# Patient Record
Sex: Female | Born: 1980 | Hispanic: No | Marital: Married
Health system: Midwestern US, Community
[De-identification: ages and names within clinical notes are randomized; demographics above are authoritative.]

## PROBLEM LIST (undated history)

## (undated) HISTORY — PX: ABDOMINAL SURGERY: SHX537

---

## 2003-01-25 ENCOUNTER — Encounter (INDEPENDENT_AMBULATORY_CARE_PROVIDER_SITE_OTHER): Payer: Self-pay

## 2003-01-25 ENCOUNTER — Ambulatory Visit (HOSPITAL_COMMUNITY): Admission: RE | Admit: 2003-01-25 | Discharge: 2003-01-25 | Payer: Self-pay | Admitting: Obstetrics and Gynecology

## 2004-10-15 ENCOUNTER — Emergency Department (HOSPITAL_COMMUNITY): Admission: EM | Admit: 2004-10-15 | Discharge: 2004-10-15 | Payer: Self-pay | Admitting: Emergency Medicine

## 2008-04-27 ENCOUNTER — Inpatient Hospital Stay (HOSPITAL_COMMUNITY): Admission: RE | Admit: 2008-04-27 | Discharge: 2008-04-29 | Payer: Self-pay | Admitting: Obstetrics and Gynecology

## 2008-08-02 ENCOUNTER — Emergency Department (HOSPITAL_COMMUNITY): Admission: EM | Admit: 2008-08-02 | Discharge: 2008-08-03 | Payer: Self-pay | Admitting: Emergency Medicine

## 2008-08-04 ENCOUNTER — Inpatient Hospital Stay (HOSPITAL_COMMUNITY): Admission: EM | Admit: 2008-08-04 | Discharge: 2008-08-08 | Payer: Self-pay | Admitting: Emergency Medicine

## 2008-08-06 ENCOUNTER — Ambulatory Visit: Payer: Self-pay | Admitting: Internal Medicine

## 2008-08-12 ENCOUNTER — Ambulatory Visit (HOSPITAL_COMMUNITY): Admission: RE | Admit: 2008-08-12 | Discharge: 2008-08-12 | Payer: Self-pay | Admitting: Gastroenterology

## 2008-08-12 ENCOUNTER — Encounter (INDEPENDENT_AMBULATORY_CARE_PROVIDER_SITE_OTHER): Payer: Self-pay | Admitting: Gastroenterology

## 2008-12-16 ENCOUNTER — Emergency Department (HOSPITAL_COMMUNITY): Admission: EM | Admit: 2008-12-16 | Discharge: 2008-12-17 | Payer: Self-pay | Admitting: Emergency Medicine

## 2008-12-19 ENCOUNTER — Encounter: Admission: RE | Admit: 2008-12-19 | Discharge: 2008-12-19 | Payer: Self-pay | Admitting: Gastroenterology

## 2008-12-23 ENCOUNTER — Ambulatory Visit (HOSPITAL_COMMUNITY): Admission: RE | Admit: 2008-12-23 | Discharge: 2008-12-23 | Payer: Self-pay | Admitting: Gastroenterology

## 2009-01-15 ENCOUNTER — Emergency Department (HOSPITAL_COMMUNITY): Admission: EM | Admit: 2009-01-15 | Discharge: 2009-01-15 | Payer: Self-pay | Admitting: Emergency Medicine

## 2009-04-07 ENCOUNTER — Ambulatory Visit (HOSPITAL_COMMUNITY): Admission: RE | Admit: 2009-04-07 | Discharge: 2009-04-07 | Payer: Self-pay | Admitting: Gastroenterology

## 2009-07-07 ENCOUNTER — Ambulatory Visit (HOSPITAL_COMMUNITY): Admission: RE | Admit: 2009-07-07 | Discharge: 2009-07-07 | Payer: Self-pay | Admitting: Gastroenterology

## 2011-01-17 LAB — CBC: WBC: 8.6 10*3/uL (ref 4.0–10.5)

## 2011-01-17 LAB — URINE MICROSCOPIC-ADD ON

## 2011-01-17 LAB — URINALYSIS, ROUTINE W REFLEX MICROSCOPIC
Hgb urine dipstick: NEGATIVE
Nitrite: NEGATIVE
Protein, ur: 30 mg/dL — AB
Urobilinogen, UA: 1 mg/dL (ref 0.0–1.0)
pH: 6 (ref 5.0–8.0)

## 2011-01-17 LAB — DIFFERENTIAL
Basophils Absolute: 0 10*3/uL (ref 0.0–0.1)
Eosinophils Relative: 2 % (ref 0–5)
Neutrophils Relative %: 70 % (ref 43–77)

## 2011-01-17 LAB — COMPREHENSIVE METABOLIC PANEL
ALT: 11 U/L (ref 0–35)
Albumin: 4.4 g/dL (ref 3.5–5.2)
Alkaline Phosphatase: 81 U/L (ref 39–117)
CO2: 25 mEq/L (ref 19–32)
Calcium: 9.5 mg/dL (ref 8.4–10.5)
Chloride: 104 mEq/L (ref 96–112)
Creatinine, Ser: 0.79 mg/dL (ref 0.4–1.2)
GFR calc Af Amer: >60 mL/min (ref >60)
Glucose, Bld: 104 mg/dL — ABNORMAL HIGH (ref 70–99)
Sodium: 138 mEq/L (ref 135–145)
Total Bilirubin: 1.5 mg/dL — ABNORMAL HIGH (ref 0.3–1.2)
Total Protein: 7 g/dL (ref 6.0–8.3)

## 2011-02-19 NOTE — Discharge Summary (Signed)
[name], [name]             ACCOUNT NO.:  [id number]   MEDICAL RECORD NO.:  [id number]          PATIENT TYPE:  INP   LOCATION:  9148                          FACILITY:  [facility]   PHYSICIAN:  Kelli [name], M.D. DATE OF BIRTH:  12/26/1980   DATE OF ADMISSION:  04/27/2008  DATE OF DISCHARGE:  04/29/2008                               DISCHARGE SUMMARY   This is a 30 year old black female, para 0-0-2-0, gravida 3, EDC April 30, 2008 admitted for induction of labor.  Blood group and type B+,  negative antibody, sickle cell AF, negative for sickle cell, RPR  nonreactive, rubella immune, hepatitis B surface antigen negative, HIV  negative, GC and chlamydia negative, 1-hour Glucola 103, and group B  strep negative.  Vaginal ultrasound on September 30, 2007, crown-rump  length 2.84 cm, 9 weeks 4 days, [facility] April 30, 2008.  She declined first  and second trimester screening.  Ultrasound on December 11, 2007 showed a  gestational age of [redacted] weeks 3 days, [facility] April 29, 2008.  The patient was  treated with iron for anemia.  Prenatal care was uncomplicated.   ALLERGIES:  No known allergies.   OPERATIONS:  In 2007, she had a left salpingectomy for tubal pregnancy.  No illnesses.   FAMILY HISTORY:  Maternal grandmother with diabetes and pancreatic  cancer.   HABITS:  Alcohol, tobacco and drugs none.   OBSTETRIC HISTORY:  In 2004 D&C for nonviable intrauterine pregnancy,  2007 left salpingectomy for left tubal pregnancy.   On admission, her vital signs were normal.  Heart and lungs were normal.  The abdomen was soft.  Fundal height had been 37 cm on March 30, 2008.  Fetal heart tones were normal.  Cervix was 1-2 cm, 70% vertex at -2 per  the admitting nurse.  Pitocin was begun per protocol by 8:57 a.m.  Pitocin was at 5 mU/minute.  Contractions every 2-3 minutes, but the  patient did not feel when the cervix was 2 cm, 70% vertex at -2.  Artificial rupture of the membranes produced clear fluid.  At  12:45  p.m., the Pitocin was at 16 mU/minute, contractions every 1 minute 45  seconds to 3+ minutes, cervix was 2 cm, 80%-90%, vertex was at -1.  By  1:45 p.m., the cervix was 3 cm, 90%, and she requested and received an  epidural.  At 6 p.m., Pitocin was at 18 mU/minute.  Cervix was 9+ cm.  The patient reached full dilatation and pushed well.  She delivered  spontaneously LOA over an intact perineum by Dr. Ambrose [name] a living female  infant, 7 pounds 6 ounces.  Apgars were 9 and 10 at 1 and 5 minutes.  Placenta was intact.  The uterus was normal.  Blood loss was about 400  mL.  Postpartum, the patient did well and was discharged on the second  postpartum day.  Initial hemoglobin 11.0, hematocrit 32.5, white count  12,000, platelet count 303,000.  Followup hemoglobin 9.6.  RPR was  nonreactive.   FINAL DIAGNOSIS:  Intrauterine pregnancy at 39 weeks delivered  left  occiput anterior.  OPERATION:  Spontaneous delivery,  left occiput anterior.   FINAL CONDITION:  Improved.   INSTRUCTIONS:  Include our regular discharge instruction booklet.  The  patient declines analgesics at discharge and is advised to return in 6  weeks for followup examination.      Kelli [name], M.D.  Electronically Signed     TFH/MEDQ  D:  04/29/2008  T:  04/29/2008  Job:  [id number]

## 2011-02-19 NOTE — H&P (Signed)
NAMESADEE, OSLAND             ACCOUNT NO.:  [id number]   MEDICAL RECORD NO.:  [id number]          PATIENT TYPE:  INP   LOCATION:  6705                         FACILITY:  [facility]   PHYSICIAN:  Renee Ramus, MD       DATE OF BIRTH:  06/07/1981   DATE OF ADMISSION:  08/04/2008  DATE OF DISCHARGE:                              HISTORY & PHYSICAL   HISTORY OF PRESENT ILLNESS:  The patient is a 30 year old female who is  complained of abdominal pain x2 days prior to admission.  The patient  states pain is at worst, it is 9/10, currently 6/10 increasing with  food.  The patient was seen in the emergency department 1 day prior to  admission where had a negative abdominal ultrasound.  The patient denies  fevers, chills, night sweats, nausea, vomiting, diarrhea, or  constipation.  The patient denies previous history of this pain.  The  patient denies any previous abdominal conditions.   PAST MEDICAL HISTORY:  Status post salpingectomy secondary to tubal  pregnancy.   SOCIAL HISTORY:  No tobacco, no alcohol, no drugs of abuse.  She is  married, lives with her spouse at home.   FAMILY HISTORY:  Not available.   REVIEW OF SYSTEMS:  All other comprehensive review of systems are  negative.   MEDICATIONS:  Currently Percocet 5/325, this prescribed in the ER 1 day  prior to admission.   ALLERGIES:  NKDA.   PHYSICAL EXAM:  GENERAL:  This is a well-developed, well-nourished,  black female currently in no apparent distress.  VITAL SIGNS:  Blood pressure 131/97, pulse 70, respiratory rate 20,  temperature 98.4.  HEENT:  No jugular venous distention or lymphadenopathy.  Oropharynx is  clear.  Mucous membranes are pink and moist.  TMs are clear bilaterally.  Pupils are equal, reactive to light and accommodation.  Extraocular  muscles are intact.  CARDIOVASCULAR:  Regular rate and rhythm without murmurs, rubs, or  gallops.  PULMONARY:  Lungs clear to auscultation bilaterally.  ABDOMEN:  Soft,  nontender, and nondistended with no hepatosplenomegaly.  Bowel sounds are present.  She has no rebound or guarding.  EXTREMITIES:  No clubbing, cyanosis, or edema.  She has good peripheral  pulses, and dorsalis pedis and radial artery.  She is able to move all  extremities.  NEUROLOGIC: Cranial nerves II through XII are grossly intact.  She has  no focal neurological deficits.   LABS:  White count 6.6, H and H 11.4 and 34, MCV 91, platelets 260.  Sodium 138, potassium 3.2, chloride 107, bicarb 24, BUN 5, creatinine  0.9, glucose 92, T bili 1.8, AST 111, ALT 87.   STUDIES:  Abdominal ultrasound showing no acute disease.  The urine  pregnancy test done 1 day prior shows negative.  UA done 1 day prior  shows concentrated urine with specific gravity of 1.028, but no evidence  of infection.   ASSESSMENT AND PLAN:  1. Abdominal pain with mild increase in LFTs, mild increase in T bili,      and negative abdominal ultrasound.  We will check hepatic panel.  We will check inflammatory markers i.e. CRP and ESR.  We will check      abdominal CT scan with contrast.  We will also recheck labs after      intravenous hydration, although the patient shows no clinical signs      of gross dehydration.  2. Dehydration as above.  3. Hypokalemia, add potassium to intravenous fluids.  4. Hyperbilirubinemia, check iron studies, haptoglobin, and LDH.  5. Anemia as above.   DISPOSITION:  The patient is full code.  H and P was constructed by  reviewing past medical history conferring with emergency medical room  physicians reviewing the emergency medical record.   TIME SPENT:  One hour.      Renee Ramus, MD  Electronically Signed     JF/MEDQ  D:  08/04/2008  T:  08/04/2008  Job:  [id number]

## 2011-02-19 NOTE — Discharge Summary (Signed)
Kelli [name], Kelli [name]             ACCOUNT NO.:  [id number]   MEDICAL RECORD NO.:  [id number]          PATIENT TYPE:  INP   LOCATION:  6705                         FACILITY:  [facility]   PHYSICIAN:  Lucita Ferrara, MD         DATE OF BIRTH:  February 03, 1981   DATE OF ADMISSION:  08/04/2008  DATE OF DISCHARGE:                               DISCHARGE SUMMARY   error      Lucita Ferrara, MD  Electronically Signed     RR/MEDQ  D:  08/08/2008  T:  08/08/2008  Job:  [id number]

## 2011-02-19 NOTE — Discharge Summary (Signed)
Kelli [name], Kelli [name]             ACCOUNT NO.:  [id number]   MEDICAL RECORD NO.:  [id number]          PATIENT TYPE:  INP   LOCATION:  6705                         FACILITY:  [facility]   PHYSICIAN:  Lucita Ferrara, MD         DATE OF BIRTH:  July 22, 1981   DATE OF ADMISSION:  08/04/2008  DATE OF DISCHARGE:  08/08/2008                               DISCHARGE SUMMARY   DISCHARGE DIAGNOSES:  1. High-grade biliary obstruction.  2. Abnormal liver function tests.  3. Epigastric pain, tenderness resolved.   CONSULTATIONS:  Dr. Audley Hose from gastroenterology services.   PROCEDURES:  1. Patient had a magnetic resonance cholangiogram MRCP which showed      long segment, 3-cm smooth narrowing of the common bile duct through      the pancreatic head with no significant biliary dilatation.  2. Patient had a HIDA scan dated August 05, 2008, which showed      delayed excretion of the radiopharmaceutical into the biliary      system.  Differential diagnosis include high-grade biliary      obstruction versus underlying hepatic dysfunction.  3. Patient had an MRCP of the abdomen which showed as above.  4. Patient had a CT scan of the abdomen and pelvis, August 04, 2008.      which showed abnormal CT appearing of the gallbladder.  There is a      high attenuation fluid in the gallbladder and probable      pericholecystic fluid.  5. Patient had abdominal ultrasound August 03, 2008, which showed no      normal abdominal ultrasound.   BRIEF HISTORY OF PRESENT ILLNESS:  Patient is a 30 year old female who  presented to [facility] on August 04, 2008, with  abdominal pain 2 days prior to admission, 9/10 in intensity, with no  vomiting but nausea.  She was admitted to medical telemetry unit and a  gastroenterology consultation was requested.  She underwent procedures  above including HIDA scan, results above, MRCP, results above.  Diet was  advanced and currently she is tolerating a nonfat diet.   Per  gastroenterology, her ERCP will be deferred to outpatient basis as she  is currently tolerating normal diet and her liver function tests have  now normalized.   DISCHARGE DIET:  Low-fat diet.   DISCHARGE FOLLOWUP:  With Dr. Elnoria Howard in the office as scheduled time per  Dr. Elnoria Howard.   DISCHARGE LABORATORY DATA:  Hepatic function test shows normal T-bili, D-  bili, and indirect bili.  Alk phos slightly high at 128.  AST and ALT,  AST 34, ALT 60, mildly high.  A basic metabolic panel within normal  limits, as is magnesium level.   DISCHARGE CONDITION:  Stable.   PHYSICAL EXAMINATION:  GENERAL: Patient is in no acute distress.  VITALS:  Blood pressure is 106/78.  Temperature 97.8.  Pulse 83.  Respirations 20.  Pulse ox is 99% on room air.  HEENT: Normocephalic, atraumatic.  Sclerae are nonicteric.  NECK:  Supple.  No JVD.  CARDIOVASCULAR:  S1-S2.  Regular rate and rhythm.  No  murmurs, rubs, or  clicks.  ABDOMEN:  Soft, nontender, nondistended.  Positive bowel sounds.  EXTREMITIES:  No clubbing, cyanosis, or edema.      Lucita Ferrara, MD  Electronically Signed     RR/MEDQ  D:  08/08/2008  T:  08/08/2008  Job:  [id number]

## 2011-02-22 NOTE — Op Note (Signed)
   NAMEGRACIELLA, Kelli [name]                         ACCOUNT NO.:  [id number]   MEDICAL RECORD NO.:  [id number]                   PATIENT TYPE:  AMB   LOCATION:  DAY                                  [facility]ACILITY:  [facility]   PHYSICIAN:  Malachi Pro. Ambrose Mantle, M.D.              DATE OF BIRTH:  Jan 28, 1981   DATE OF PROCEDURE:  01/25/2003  DATE OF DISCHARGE:                                 OPERATIVE REPORT   PREOPERATIVE DIAGNOSES:  Intrauterine fetal demise 9 plus weeks.   POSTOPERATIVE DIAGNOSES:  Intrauterine fetal demise 9 plus weeks.   OPERATION:  Suction D&C.   SURGEON:  Malachi Pro. Ambrose Mantle, M.D.   ANESTHESIA:  General.   DESCRIPTION OF PROCEDURE:  The patient was brought to the operating room and  placed under satisfactory general anesthesia. She was placed in lithotomy  position in the [place] stirrups. The vulva, vagina and urethra were prepped  with Betadine solution and the bladder was emptied with a [place] catheter.  Exam revealed the uterus to be anterior approximately 10 week size, the  adnexa were free of masses. The cervix was drawn into the operative field  and sounded to 11 cm anteriorly. The cervix was then dilated to a 31 Pratt  dilator, a #9 curved suction curette was used to enter the uterine cavity  and suction was done. I did not seem to get much tissue. I finally found  some tissue and tried to remove some of it with the polyp forceps, I did not  get a lot of tissue removed so I went back with the suction curette and  continued to curette and with a combination of curetting and pulling the  tissue out with the polyp forceps felt like I got all the tissue out. The  uterus contracted down nicely with Pitocin. I used a sharp curette, did not  feel any additional tissue but I made another circuit with the #8 curved  suction curette and felt like that I had the entire cavity emptied, all the  tissue removed, bleeding was minimal and the procedure was terminated. The  patient seemed  to tolerate the procedure well and was returned to recovery  in satisfactory condition.                                               Malachi Pro. Ambrose Mantle, M.D.    TFH/MEDQ  D:  01/25/2003  T:  01/25/2003  Job:  [id number]

## 2011-07-05 LAB — CBC
HCT: 32.6 — ABNORMAL LOW
Hemoglobin: 11 — ABNORMAL LOW
MCV: 93.9
RBC: 3.01 — ABNORMAL LOW
RBC: 3.47 — ABNORMAL LOW
WBC: 18.3 — ABNORMAL HIGH

## 2011-07-08 LAB — COMPREHENSIVE METABOLIC PANEL
ALT: 87 — ABNORMAL HIGH
ALT: 89 — ABNORMAL HIGH
AST: 111 — ABNORMAL HIGH
AST: 65 — ABNORMAL HIGH
AST: 96 — ABNORMAL HIGH
Alkaline Phosphatase: 115
Alkaline Phosphatase: 92
BUN: 5 — ABNORMAL LOW
BUN: 6
CO2: 23
CO2: 23
CO2: 25
Calcium: 9
Calcium: 9.1
Chloride: 106
Chloride: 109
Creatinine, Ser: 0.77
Creatinine, Ser: 0.91
GFR calc Af Amer: >60
GFR calc Af Amer: >60
GFR calc non Af Amer: >60
GFR calc non Af Amer: >60
Glucose, Bld: 103 — ABNORMAL HIGH
Glucose, Bld: 70
Glucose, Bld: 92
Potassium: 3.2 — ABNORMAL LOW
Potassium: 4.2
Sodium: 136
Sodium: 138
Total Bilirubin: 0.4
Total Bilirubin: 1.9 — ABNORMAL HIGH
Total Protein: 6
Total Protein: 6.2

## 2011-07-08 LAB — DIFFERENTIAL
Basophils Absolute: 0
Basophils Relative: 1
Eosinophils Absolute: 0.4
Eosinophils Relative: 2
Eosinophils Relative: 5
Lymphocytes Relative: 24
Monocytes Absolute: 0.4
Monocytes Relative: 7
Neutro Abs: 7.2
Neutrophils Relative %: 49

## 2011-07-08 LAB — CBC
HCT: 36
Hemoglobin: 11.1 — ABNORMAL LOW
Hemoglobin: 11.4 — ABNORMAL LOW
MCV: 90.3
RBC: 3.59 — ABNORMAL LOW
RBC: 3.76 — ABNORMAL LOW
RBC: 3.99
RDW: 13.3
RDW: 13.4
WBC: 10.3

## 2011-07-08 LAB — HEPATIC FUNCTION PANEL
Albumin: 3.6
Indirect Bilirubin: 0.9
Total Protein: 6.3

## 2011-07-08 LAB — LIPID PANEL
LDL Cholesterol: 49
Triglycerides: 97

## 2011-07-08 LAB — URINALYSIS, ROUTINE W REFLEX MICROSCOPIC
Glucose, UA: NEGATIVE
Ketones, ur: NEGATIVE
Nitrite: NEGATIVE
Protein, ur: NEGATIVE
Urobilinogen, UA: 0.2
pH: 7.5

## 2011-07-08 LAB — LACTATE DEHYDROGENASE: LDH: 258 — ABNORMAL HIGH

## 2011-07-08 LAB — LIPASE, BLOOD: Lipase: 28

## 2011-07-08 LAB — FERRITIN: Ferritin: 34 (ref 10–291)

## 2011-07-08 LAB — RETICULOCYTES: Retic Count, Absolute: 47.3

## 2011-07-08 LAB — T4, FREE: Free T4: 1.25

## 2011-07-08 LAB — IRON AND TIBC: UIBC: 218

## 2011-07-08 LAB — HAPTOGLOBIN: Haptoglobin: 159

## 2011-07-08 LAB — TSH: TSH: 3.788

## 2011-07-08 LAB — C-REACTIVE PROTEIN: CRP: 0.4 — ABNORMAL LOW (ref <0.6)

## 2011-07-09 LAB — BASIC METABOLIC PANEL
Chloride: 106
GFR calc Af Amer: >60
GFR calc non Af Amer: >60
Potassium: 3.9
Sodium: 137

## 2011-07-09 LAB — HEPATIC FUNCTION PANEL
ALT: 60 — ABNORMAL HIGH
ALT: 73 — ABNORMAL HIGH
AST: 34
AST: 39 — ABNORMAL HIGH
Albumin: 3.9
Albumin: 4.3
Alkaline Phosphatase: 136 — ABNORMAL HIGH
Bilirubin, Direct: 0.2
Indirect Bilirubin: 1.1 — ABNORMAL HIGH
Total Bilirubin: 0.9
Total Protein: 6.9

## 2012-09-23 ENCOUNTER — Emergency Department (HOSPITAL_COMMUNITY)
Admission: EM | Admit: 2012-09-23 | Discharge: 2012-09-24 | Disposition: A | Discharge status: Home / Self Care | Payer: No Typology Code available for payment source | Attending: Emergency Medicine | Admitting: Emergency Medicine

## 2012-09-23 ENCOUNTER — Encounter (HOSPITAL_COMMUNITY): Payer: Self-pay

## 2012-09-23 DIAGNOSIS — S139XXA Sprain of joints and ligaments of unspecified parts of neck, initial encounter: Secondary | ICD-10-CM | POA: Insufficient documentation

## 2012-09-23 DIAGNOSIS — S46819A Strain of other muscles, fascia and tendons at shoulder and upper arm level, unspecified arm, initial encounter: Secondary | ICD-10-CM

## 2012-09-23 DIAGNOSIS — F17200 Nicotine dependence, unspecified, uncomplicated: Secondary | ICD-10-CM | POA: Insufficient documentation

## 2012-09-23 DIAGNOSIS — Y9389 Activity, other specified: Secondary | ICD-10-CM | POA: Insufficient documentation

## 2012-09-23 DIAGNOSIS — Y92410 Unspecified street and highway as the place of occurrence of the external cause: Secondary | ICD-10-CM | POA: Insufficient documentation

## 2012-09-23 NOTE — ED Notes (Signed)
Pt. Was called 3 times and there was no one in the waiting room by this name.

## 2012-09-23 NOTE — ED Notes (Signed)
Pt has come up to front desk stating that she has been waiting.  Pt had been taken off waiting list earlier d/t not being in lobby.
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
## 2012-09-24 ENCOUNTER — Emergency Department (HOSPITAL_COMMUNITY): Payer: No Typology Code available for payment source

## 2012-09-24 MED ORDER — NAPROXEN 500 MG PO TABS: 500.0000 mg | ORAL_TABLET | Freq: Two times a day (BID) | ORAL | Status: AC

## 2012-09-24 MED ORDER — METHOCARBAMOL 500 MG PO TABS: 500.0000 mg | ORAL_TABLET | Freq: Two times a day (BID) | ORAL | Status: AC

## 2012-09-24 NOTE — ED Provider Notes (Signed)
Medical screening examination/treatment/procedure(s) were performed by non-physician practitioner and as supervising physician I was immediately available for consultation/collaboration.   Karita Dralle T Kasheena Sambrano, MD 09/24/12 0816 
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
## 2015-01-06 ENCOUNTER — Inpatient Hospital Stay (HOSPITAL_COMMUNITY): Payer: 59

## 2015-01-06 ENCOUNTER — Encounter (HOSPITAL_COMMUNITY): Payer: Self-pay | Admitting: *Deleted

## 2015-01-06 ENCOUNTER — Inpatient Hospital Stay (HOSPITAL_COMMUNITY)
Admission: AD | Admit: 2015-01-06 | Discharge: 2015-01-07 | Disposition: A | Discharge status: Home / Self Care | Payer: 59 | Source: Ambulatory Visit | Attending: Obstetrics & Gynecology | Admitting: Obstetrics & Gynecology

## 2015-01-06 DIAGNOSIS — Z30431 Encounter for routine checking of intrauterine contraceptive device: Secondary | ICD-10-CM | POA: Diagnosis not present

## 2015-01-06 DIAGNOSIS — R102 Pelvic and perineal pain: Secondary | ICD-10-CM | POA: Diagnosis not present

## 2015-01-06 DIAGNOSIS — F17210 Nicotine dependence, cigarettes, uncomplicated: Secondary | ICD-10-CM | POA: Diagnosis not present

## 2015-01-06 LAB — POCT PREGNANCY, URINE: Preg Test, Ur: NEGATIVE

## 2015-01-06 MED ORDER — KETOROLAC TROMETHAMINE 60 MG/2ML IM SOLN
60.0000 mg | Freq: Once | INTRAMUSCULAR | Status: AC
  Administered 2015-01-06: 60 mg via INTRAMUSCULAR
  Filled 2015-01-06: qty 2

## 2015-01-06 NOTE — MAU Provider Note (Signed)
History     CSN: [id number]  Arrival date and time: 01/06/15 2305   None     Chief Complaint  Patient presents with  . Abdominal Pain   Abdominal Pain This is a new problem. The current episode started today. The onset quality is gradual. The problem occurs constantly. The problem has been unchanged. The pain is located in the suprapubic region. The pain is moderate. The quality of the pain is cramping and sharp. The abdominal pain does not radiate. Pertinent negatives include no constipation, diarrhea, dysuria, fever, frequency, headaches, myalgias, nausea or vomiting. The pain is aggravated by certain positions. The pain is relieved by being still. She has tried nothing for the symptoms. There is no history of abdominal surgery.   This is a 34 y.o. female who presents with c/o sharp and crampy pelvic pain which started about an hour after intercourse. It got sharper and she took a shower. Then it got more sharp. She checked her IUD string and it felt longer to her than normal. She has a MIrena placed by Dr Ambrose Mantle, but she thinks it might have been more than 5 years ago.   RN Note: Had sex about 1630 and immediately afterward started having sharp lower abd pain. Has IUD. Small amt bleeding after having sex but none now. String to IUD feels longer than normally does          OB History    Gravida Para Term Preterm AB TAB SAB Ectopic Multiple Living   No past medical history on file.  Past Surgical History  Procedure Laterality Date  . Abdominal surgery      No family history on file.  History  Substance Use Topics  . Smoking status: Current Every Day Smoker -- 0.50 packs/day    Types: Cigarettes  . Smokeless tobacco: Not on file  . Alcohol Use: Yes    Allergies: No Known Allergies  Prescriptions prior to admission  Medication Sig Dispense Refill Last Dose  . levonorgestrel (MIRENA) 20 MCG/24HR IUD 1 each by Intrauterine route once.    09/23/2012 at Unknown  . methocarbamol (ROBAXIN) 500 MG tablet Take 1 tablet (500 mg total) by mouth 2 (two) times daily. 20 tablet 0   . naproxen (NAPROSYN) 500 MG tablet Take 1 tablet (500 mg total) by mouth 2 (two) times daily. 30 tablet 0     Review of Systems  Constitutional: Negative for fever and malaise/fatigue.  Respiratory: Negative for shortness of breath.   Cardiovascular: Negative for chest pain.  Gastrointestinal: Positive for abdominal pain. Negative for nausea, vomiting, diarrhea and constipation.  Genitourinary: Negative for dysuria and frequency.  Musculoskeletal: Negative for myalgias and back pain.  Neurological: Negative for weakness and headaches.   Physical Exam   Blood pressure 118/72, pulse 91, temperature 97.8 F (36.6 C), resp. rate 24, height  (1.651 m), weight 133 lb 12.8 oz (60.691 kg).  Physical Exam  Constitutional: She is oriented to person, place, and time. She appears well-developed and well-nourished. No distress.  HENT:  Head: Normocephalic.  Cardiovascular: Normal rate and regular rhythm.   Respiratory: Effort normal.  GI: Soft. She exhibits no distension and no mass. There is tenderness (over suprapubic area). There is no rebound and no guarding.  Genitourinary: Vagina normal. No vaginal discharge found.  Cervix closed There is some cervical motion tenderness and uterine tenderness. Declines STD testing  Musculoskeletal: Normal  range of motion.  Neurological: She is alert and oriented to person, place, and time.  Skin: Skin is warm and dry.  Psychiatric: She has a normal mood and affect.    MAU Course  Procedures  MDM Will check UA, and pelvic US. WIll give Toradol for pain.  US negative. IUD in good position. Ovaries and uterus normal States Toradol did not help much. Will give a Tramadol Discussed findings. Encouraged her to seek GYN care to get IUD replaced.  Reviewed risk of pregnancy Results for orders placed or performed  during the hospital encounter of 01/06/15 (from the past 24 hour(s))  Urinalysis, Routine w reflex microscopic     Status: Abnormal   Collection Time: 01/06/15 11:20 PM  Result Value Ref Range   Color, Urine YELLOW YELLOW   APPearance CLEAR CLEAR   Specific Gravity, Urine >1.030 (H) 1.005 - 1.030   pH 6.0 5.0 - 8.0   Glucose, UA NEGATIVE NEGATIVE mg/dL   Hgb urine dipstick LARGE (A) NEGATIVE   Bilirubin Urine NEGATIVE NEGATIVE   Ketones, ur 15 (A) NEGATIVE mg/dL   Protein, ur NEGATIVE NEGATIVE mg/dL   Urobilinogen, UA 0.2 0.0 - 1.0 mg/dL   Nitrite NEGATIVE NEGATIVE   Leukocytes, UA NEGATIVE NEGATIVE  Urine microscopic-add on     Status: None   Collection Time: 01/06/15 11:20 PM  Result Value Ref Range   Squamous Epithelial / LPF RARE RARE   WBC, UA 0-2 <3 WBC/hpf   RBC / HPF 0-2 <3 RBC/hpf   Bacteria, UA RARE RARE   Urine-Other MUCOUS PRESENT   Pregnancy, urine POC     Status: None   Collection Time: 01/06/15 11:28 PM  Result Value Ref Range   Preg Test, Ur NEGATIVE NEGATIVE    Assessment and Plan  A:  Pelvic pain following intercourse      Normal IUD placement but IUD expired  P;  Discharge home       Recommend Naproxen or ibuprofen for pain       Followup with GYN   [facility] LLCWILLIAMS,Armani Gawlik 01/06/2015, 11:26 PM

## 2015-01-06 NOTE — Progress Notes (Signed)
Bimanual only  

## 2015-01-06 NOTE — MAU Note (Signed)
Had sex about 1630 and immediately afterward started having sharp lower abd pain. Has IUD. Small amt bleeding after having sex but none now. String to IUD feels longer than normally does

## 2015-01-07 DIAGNOSIS — R102 Pelvic and perineal pain: Secondary | ICD-10-CM

## 2015-01-07 LAB — URINE MICROSCOPIC-ADD ON

## 2015-01-07 LAB — URINALYSIS, ROUTINE W REFLEX MICROSCOPIC
Bilirubin Urine: NEGATIVE
Glucose, UA: NEGATIVE mg/dL
Ketones, ur: 15 mg/dL — AB
Leukocytes, UA: NEGATIVE
NITRITE: NEGATIVE
Protein, ur: NEGATIVE mg/dL
UROBILINOGEN UA: 0.2 mg/dL (ref 0.0–1.0)
pH: 6 (ref 5.0–8.0)

## 2015-01-07 MED ORDER — TRAMADOL HCL 50 MG PO TABS
50.0000 mg | ORAL_TABLET | Freq: Once | ORAL | Status: AC
  Administered 2015-01-07: 50 mg via ORAL
  Filled 2015-01-07: qty 1

## 2015-01-07 NOTE — Progress Notes (Signed)
Pt requesting more pain med. Kelli BourgeoisMarie [name] CNM made aware

## 2015-01-07 NOTE — Progress Notes (Signed)
Marie Williams CNM in earlier to discuss test results and d/c plan. Written and verbal d/c instructions given and understanding voiced °

## 2015-01-07 NOTE — Discharge Instructions (Signed)
Abdominal Pain, Women Abdominal (stomach, pelvic, or belly) pain can be caused by many things. It is important to tell your doctor:  The location of the pain.  Does it come and go or is it present all the time?  Are there things that start the pain (eating certain foods, exercise)?  Are there other symptoms associated with the pain (fever, nausea, vomiting, diarrhea)? All of this is helpful to know when trying to find the cause of the pain. CAUSES   Stomach: virus or bacteria infection, or ulcer.  Intestine: appendicitis (inflamed appendix), regional ileitis (Crohn's disease), ulcerative colitis (inflamed colon), irritable bowel syndrome, diverticulitis (inflamed diverticulum of the colon), or cancer of the stomach or intestine.  Gallbladder disease or stones in the gallbladder.  Kidney disease, kidney stones, or infection.  Pancreas infection or cancer.  Fibromyalgia (pain disorder).  Diseases of the female organs:  Uterus: fibroid (non-cancerous) tumors or infection.  Fallopian tubes: infection or tubal pregnancy.  Ovary: cysts or tumors.  Pelvic adhesions (scar tissue).  Endometriosis (uterus lining tissue growing in the pelvis and on the pelvic organs).  Pelvic congestion syndrome (female organs filling up with blood just before the menstrual period).  Pain with the menstrual period.  Pain with ovulation (producing an egg).  Pain with an IUD (intrauterine device, birth control) in the uterus.  Cancer of the female organs.  Functional pain (pain not caused by a disease, may improve without treatment).  Psychological pain.  Depression. DIAGNOSIS  Your doctor will decide the seriousness of your pain by doing an examination.  Blood tests.  X-rays.  Ultrasound.  CT scan (computed tomography, special type of X-ray).  MRI (magnetic resonance imaging).  Cultures, for infection.  Barium enema (dye inserted in the large intestine, to better view it with  X-rays).  Colonoscopy (looking in intestine with a lighted tube).  Laparoscopy (minor surgery, looking in abdomen with a lighted tube).  Major abdominal exploratory surgery (looking in abdomen with a large incision). TREATMENT  The treatment will depend on the cause of the pain.   Many cases can be observed and treated at home.  Over-the-counter medicines recommended by your caregiver.  Prescription medicine.  Antibiotics, for infection.  Birth control pills, for painful periods or for ovulation pain.  Hormone treatment, for endometriosis.  Nerve blocking injections.  Physical therapy.  Antidepressants.  Counseling with a psychologist or psychiatrist.  Minor or major surgery. HOME CARE INSTRUCTIONS  Do not take Ibuprofen (Motrin) if you are also taking Naproxen. You can take either one but not both.  Do not take laxatives, unless directed by your caregiver.  Take over-the-counter pain medicine only if ordered by your caregiver. Do not take aspirin because it can cause an upset stomach or bleeding.  Try a clear liquid diet (broth or water) as ordered by your caregiver. Slowly move to a bland diet, as tolerated, if the pain is related to the stomach or intestine.  Have a thermometer and take your temperature several times a day, and record it.  Bed rest and sleep, if it helps the pain.  Avoid sexual intercourse, if it causes pain.  Avoid stressful situations.  Keep your follow-up appointments and tests, as your caregiver orders.  If the pain does not go away with medicine or surgery, you may try:  Acupuncture.  Relaxation exercises (yoga, meditation).  Group therapy.  Counseling. SEEK MEDICAL CARE IF:   You notice certain foods cause stomach pain.  Your home care treatment is not helping  your pain.  You need stronger pain medicine.  You want your IUD removed.  You feel faint or lightheaded.  You develop nausea and vomiting.  You develop a  rash.  You are having side effects or an allergy to your medicine. SEEK IMMEDIATE MEDICAL CARE IF:   Your pain does not go away or gets worse.  You have a fever.  Your pain is felt only in portions of the abdomen. The right side could possibly be appendicitis. The left lower portion of the abdomen could be colitis or diverticulitis.  You are passing blood in your stools (bright red or black tarry stools, with or without vomiting).  You have blood in your urine.  You develop chills, with or without a fever.  You pass out. MAKE SURE YOU:   Understand these instructions.  Will watch your condition.  Will get help right away if you are not doing well or get worse. Document Released: 07/21/2007 Document Revised: 02/07/2014 Document Reviewed: 08/10/2009 [facility] Patient Information 2015 [place], [place]. This information is not intended to replace advice given to you by your health care provider. Make sure you discuss any questions you have with your health care provider.

## 2015-07-11 IMAGING — US US PELVIS COMPLETE
1 series · 13 of 25 positions shown · non-contrast
Comparison: CT of the abdomen and pelvis performed 08/04/2008

CLINICAL DATA: Acute onset of sharp pelvic pain, one hour after
intercourse. Assess position of intrauterine device. Initial
encounter.

EXAM:
TRANSABDOMINAL AND TRANSVAGINAL ULTRASOUND OF PELVIS
TECHNIQUE: Both transabdominal and transvaginal ultrasound examinations of the
pelvis were performed. Transabdominal technique was performed for
global imaging of the pelvis including uterus, ovaries, adnexal
regions, and pelvic cul-de-sac. It was necessary to proceed with
endovaginal exam following the transabdominal exam to visualize the
uterus and ovaries in greater detail.

[Series 1: us pelvis complete · 13 of 44 slices shown]
[im 1/44]
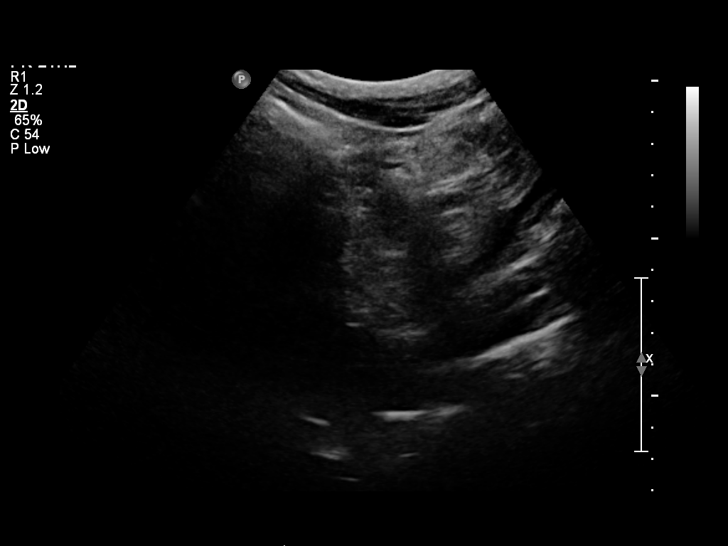
[im 4/44]
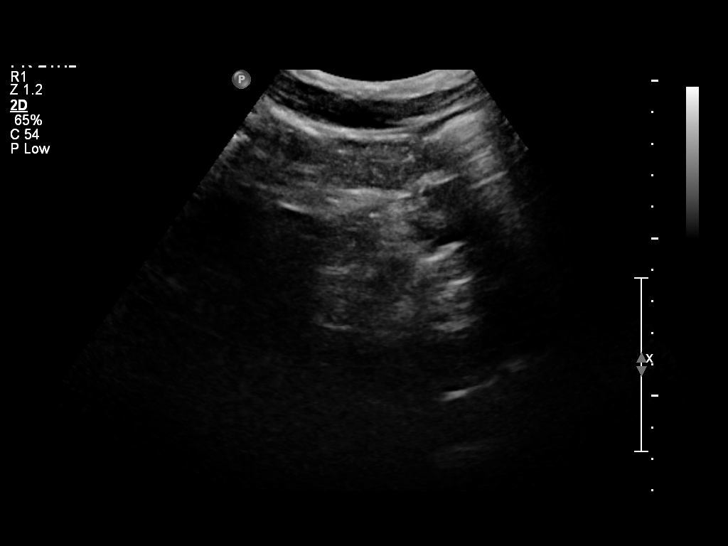
[im 8/44]
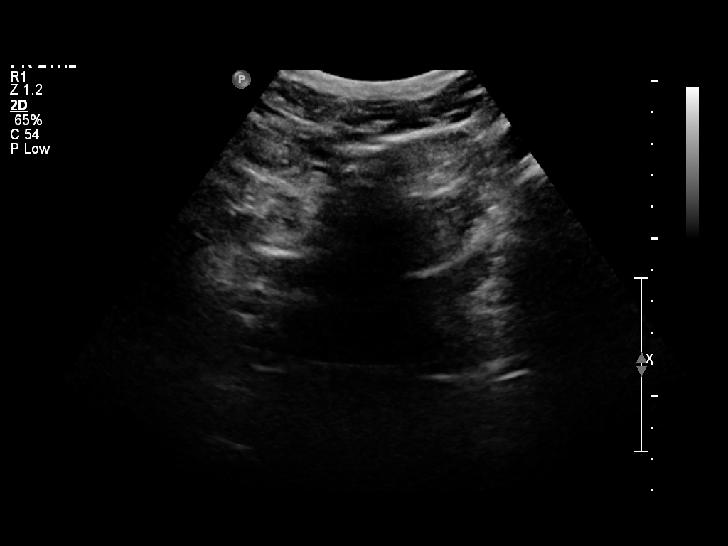
[im 11/44]
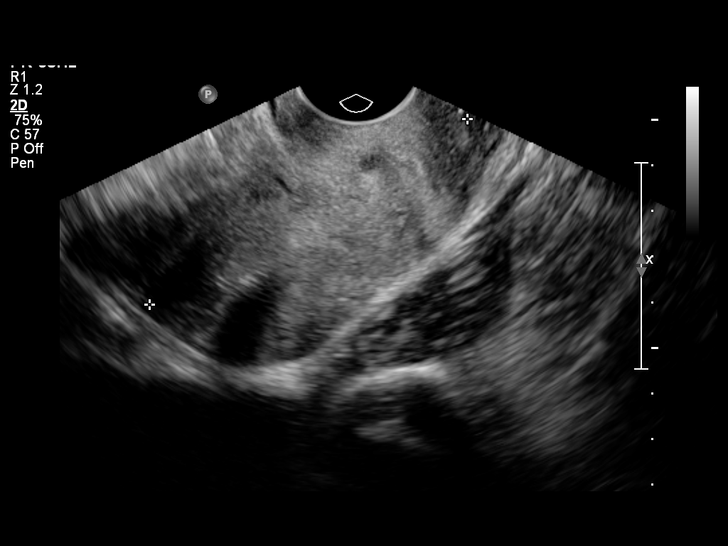
[im 15/44]
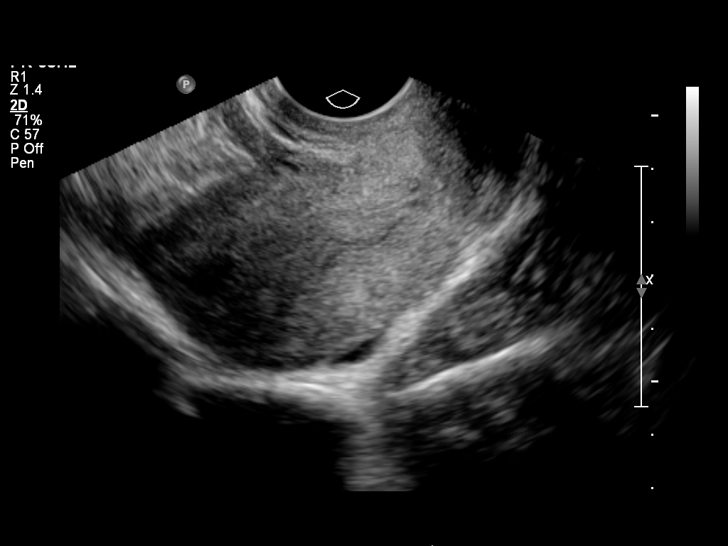
[im 18/44]
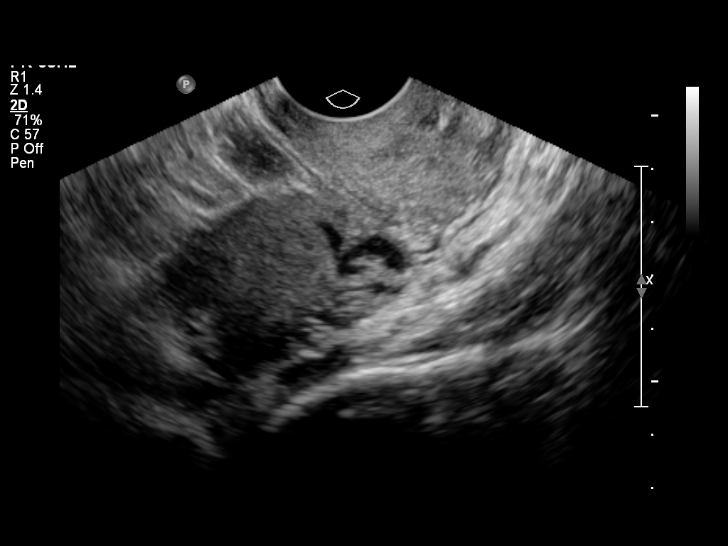
[im 22/44]
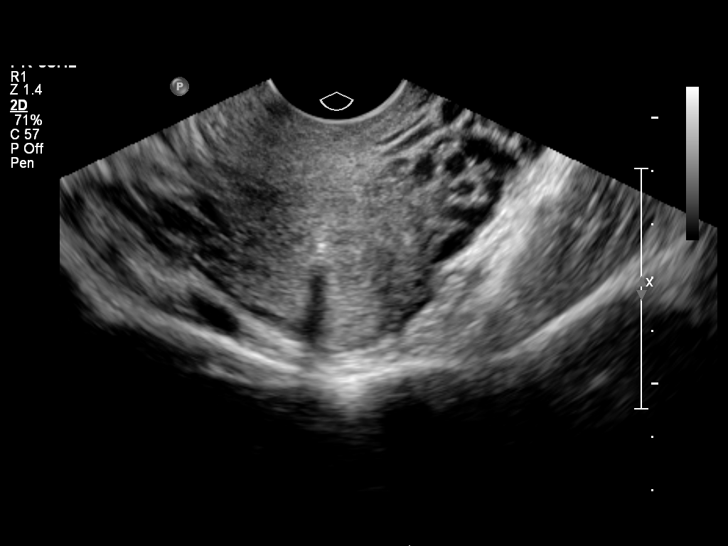
[im 26/44]
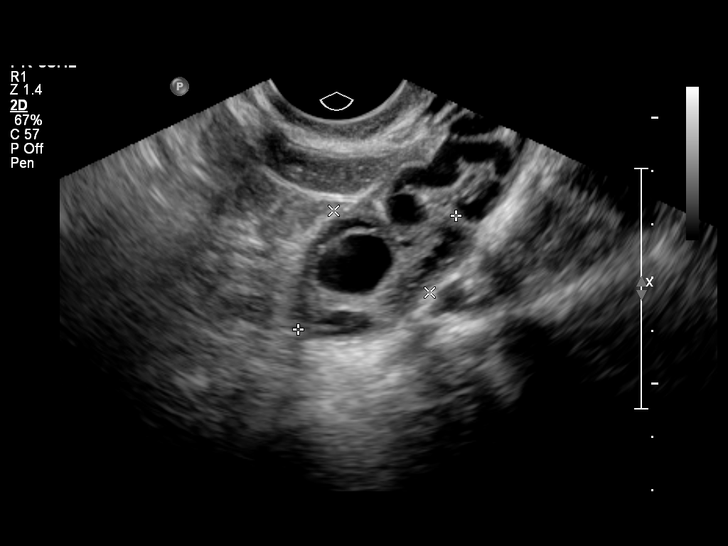
[im 29/44]
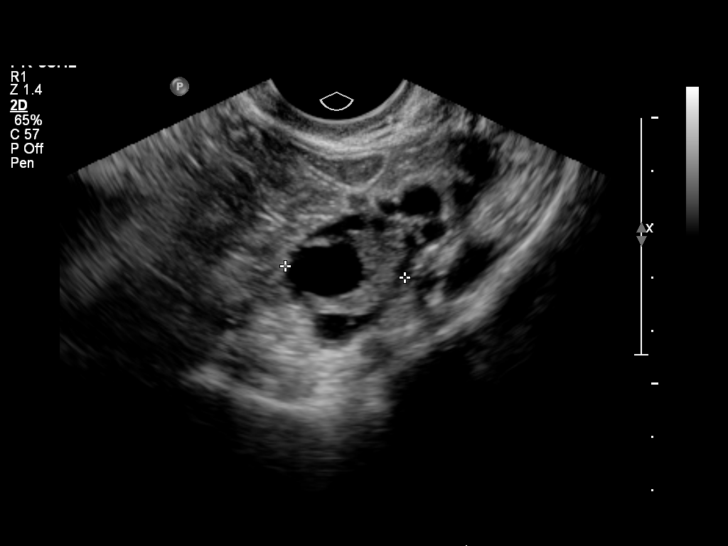
[im 33/44]
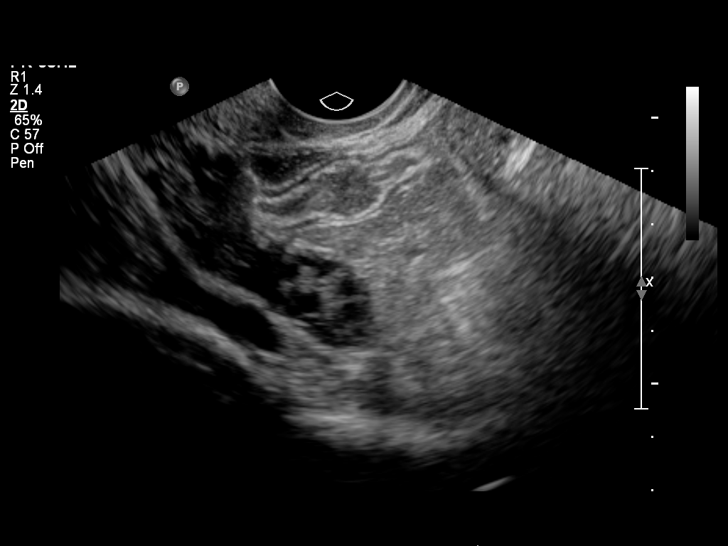
[im 36/44]
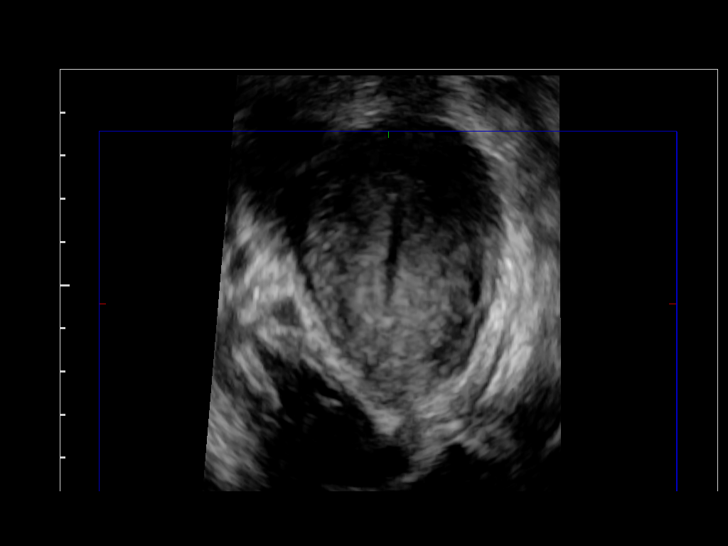
[im 40/44]
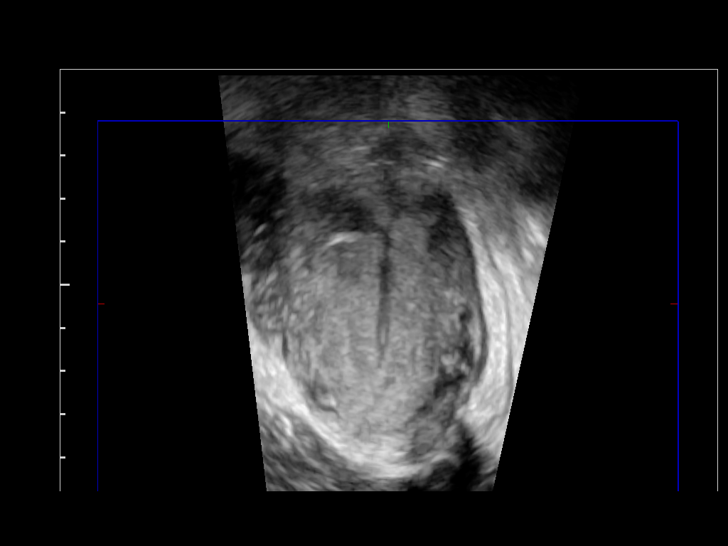
[im 44/44]
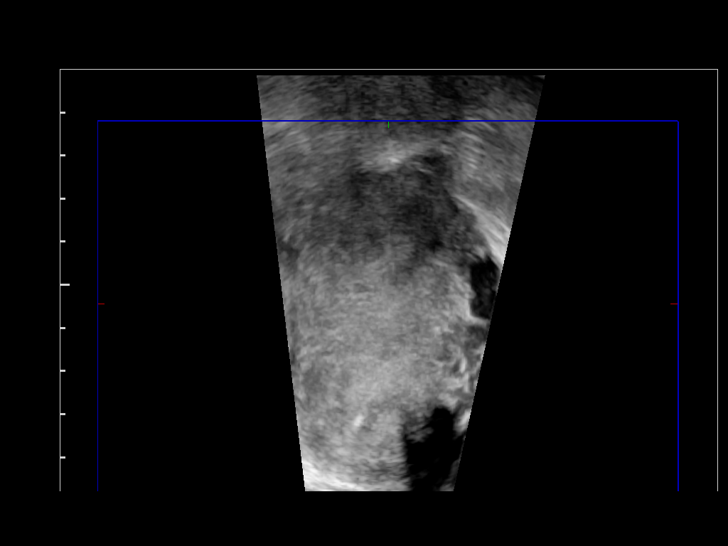

[13 of 25 positions shown; findings below may reference images not displayed]

FINDINGS: Uterus

Measurements: 8.1 x 4.1 x 4.5 cm. No fibroids or other mass
visualized.

Endometrium

Thickness: 0.7 cm. The patient's intrauterine device is noted in
expected position at the fundus of the uterus.

Right ovary

Measurements: 2.7 x 1.8 x 1.6 cm. Normal appearance/no adnexal mass.

Left ovary

Measurements: 3.7 x 2.4 x 2.3 cm. Normal appearance/no adnexal mass.

Other findings

No free fluid is seen within the pelvic cul-de-sac.
IMPRESSION: Unremarkable pelvic ultrasound. Intrauterine device noted in
expected position at the fundus of the uterus.

## 2021-06-03 ENCOUNTER — Ambulatory Visit: Admit: 2021-06-03 | Discharge: 2021-06-03 | Payer: PRIVATE HEALTH INSURANCE | Attending: Family

## 2021-06-03 DIAGNOSIS — K644 Residual hemorrhoidal skin tags: Secondary | ICD-10-CM

## 2021-06-03 MED ORDER — LIDOCAINE-HYDROCORTISONE ACE 3-2.5 % RE KIT
PACK | Freq: Two times a day (BID) | RECTAL | 0 refills | Status: AC
Start: 2021-06-03 — End: 2021-06-13

## 2021-06-03 NOTE — Progress Notes (Signed)
Cheryl Donovan (DOB:  December 16, 1980) is a 40 y.o. female,New patient, here for evaluation of the following chief complaint(s):  Hemorrhoids (Pt states that she has has Hemorid for a while but they just started hurting this weekend. Has did every at home treatment she can think of nothing is helping. Pt is 7 months pregnant)      History of Present Illness:     40 year old female presents with complaints of external hemorrhoids.  Patient states symptoms started over this past weekend she is currently visiting from out of state.  Patient is returning home to [place] today, which involves a 4-hour car ride.  She has been using over-the-counter medication with no relief.  She feels as though hemorrhoids of gotten bigger as well.  She is currently 7 months pregnant.  She has not contacted her OB/GYN.    History reviewed. No pertinent past medical history.   History reviewed. No pertinent surgical history.   Current Outpatient Medications   Medication Sig Dispense Refill    ferrous sulfate (IRON 325) 325 (65 Fe) MG tablet Take 325 mg by mouth      Prenatal Vit-Fe Fumarate-FA (PRENATAL 1+1 PO) Take 1 tablet by mouth daily      Lidocaine-Hydrocortisone Ace 3-2.5 % KIT Place 1 ampule rectally 2 times daily for 10 days 1 kit 0     No current facility-administered medications for this visit.      No Known Allergies    Vitals:    06/03/21 0947   BP: (!) 113/59   Site: Right Upper Arm   Position: Sitting   Cuff Size: Medium Adult   Pulse: 99   Resp: 18   Temp: 97.5 ??F (36.4 ??C)   TempSrc: Oral   SpO2: 99%   Weight: 173 lb 1.6 oz (78.5 kg)         Physical Exam:  Physical Exam  Constitutional:       Appearance: Normal appearance.   Cardiovascular:      Rate and Rhythm: Normal rate and regular rhythm.   Pulmonary:      Effort: Pulmonary effort is normal.   Genitourinary:     Comments: Multiple external hemorrhoids nonthrombosed.  No active drainage  Neurological:      Mental Status: She is alert.            ASSESSMENT/PLAN:     ICD-10-CM    1. External hemorrhoids without complication  N36.1 Lidocaine-Hydrocortisone Ace 3-2.5 % KIT           1. External hemorrhoids without complication  -     Lidocaine-Hydrocortisone Ace 3-2.5 % KIT; Place 1 ampule rectally 2 times daily for 10 days, Disp-1 kit, R-0Normal      Visit Diagnoses         Codes    External hemorrhoids without complication    -  Primary K64.4                 Lidocaine and hydrocortisone kit prescribed to pharmacy.  Patient has been advised to contact her OB/GYN upon arrival back home for reevaluation and management of hemorrhoids.      Return if symptoms worsen or fail to improve, OB/GYN, for reevaluation.      Electronically signed by:  --Nicolasa Ducking, APRN - NP
